# Patient Record
Sex: Male | Born: 1995 | Race: White | Marital: Single | Smoking: Never smoker
Health system: Southern US, Community
[De-identification: ages and names within clinical notes are randomized; demographics above are authoritative.]

## PROBLEM LIST (undated history)

## (undated) DIAGNOSIS — J45909 Unspecified asthma, uncomplicated: Secondary | ICD-10-CM

## (undated) HISTORY — DX: Unspecified asthma, uncomplicated: J45.909

---
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
## 2015-08-27 DIAGNOSIS — J029 Acute pharyngitis, unspecified: Secondary | ICD-10-CM | POA: Diagnosis not present

## 2016-01-01 ENCOUNTER — Inpatient Hospital Stay: Admit: 2016-01-01 | Payer: Self-pay

## 2016-01-01 ENCOUNTER — Other Ambulatory Visit: Payer: Self-pay | Admitting: Family Medicine

## 2016-01-01 ENCOUNTER — Ambulatory Visit
Admission: RE | Admit: 2016-01-01 | Discharge: 2016-01-01 | Disposition: A | Discharge status: Home / Self Care | Payer: Self-pay | Source: Ambulatory Visit | Attending: Family Medicine | Admitting: Family Medicine

## 2016-01-01 DIAGNOSIS — R52 Pain, unspecified: Secondary | ICD-10-CM

## 2016-01-01 DIAGNOSIS — M25521 Pain in right elbow: Secondary | ICD-10-CM | POA: Insufficient documentation

## 2016-01-07 DIAGNOSIS — M25521 Pain in right elbow: Secondary | ICD-10-CM | POA: Diagnosis not present

## 2016-01-11 ENCOUNTER — Other Ambulatory Visit: Payer: Self-pay | Admitting: Family Medicine

## 2016-01-11 ENCOUNTER — Ambulatory Visit
Admission: RE | Admit: 2016-01-11 | Discharge: 2016-01-11 | Disposition: A | Discharge status: Home / Self Care | Payer: BLUE CROSS/BLUE SHIELD | Source: Ambulatory Visit | Attending: Family Medicine | Admitting: Family Medicine

## 2016-01-11 DIAGNOSIS — M25621 Stiffness of right elbow, not elsewhere classified: Secondary | ICD-10-CM

## 2016-01-11 DIAGNOSIS — M25521 Pain in right elbow: Secondary | ICD-10-CM | POA: Diagnosis present

## 2016-01-11 DIAGNOSIS — T1490 Injury, unspecified: Secondary | ICD-10-CM | POA: Diagnosis present

## 2016-01-11 DIAGNOSIS — M25421 Effusion, right elbow: Secondary | ICD-10-CM | POA: Insufficient documentation

## 2016-01-11 DIAGNOSIS — Y9367 Activity, basketball: Secondary | ICD-10-CM | POA: Diagnosis not present

## 2017-03-16 ENCOUNTER — Encounter: Payer: Self-pay | Admitting: Family Medicine

## 2017-03-16 ENCOUNTER — Ambulatory Visit (INDEPENDENT_AMBULATORY_CARE_PROVIDER_SITE_OTHER): Payer: BLUE CROSS/BLUE SHIELD | Admitting: Family Medicine

## 2017-03-16 VITALS — BP 120/70 | HR 63 | Temp 97.4°F | Resp 16

## 2017-03-16 DIAGNOSIS — J3089 Other allergic rhinitis: Secondary | ICD-10-CM

## 2017-03-16 DIAGNOSIS — J45901 Unspecified asthma with (acute) exacerbation: Secondary | ICD-10-CM | POA: Diagnosis not present

## 2017-03-16 MED ORDER — ALBUTEROL SULFATE HFA 108 (90 BASE) MCG/ACT IN AERS: 2.0000 | INHALATION_SPRAY | Freq: Four times a day (QID) | RESPIRATORY_TRACT | 2 refills | Status: AC | PRN

## 2017-03-16 NOTE — Progress Notes (Signed)
Patient presents today with symptoms of seasonal allergies. Patient states that on Tuesday he believes he had an asthma attack. His symptoms resolved after taking albuterol inhaler that was in the training room. Patient states that he had a history of asthma as a child but was told that he had outgrown it. He denies feeling anxious such as a panic attack when he was having the symptoms. He states it was right before a baseball game. He has had some rhinorrhea and nasal congestion which she has been taking Zyrtec and Flonase for. He has had allergy testing in the past. He has not had any problems since that time. He requests to have a Albuterol inhaler in case he has symptoms again. He denies taking any other medications or supplements. He denies any chest pain or shortness of breath currently. He denies any productive cough or fever.  ROS: Negative except mentioned above. Vitals as per Epic.  GENERAL: NAD HEENT: mild pharyngeal erythema, no exudate, no erythema of TMs, no cervical LAD RESP: CTA B, no wheezing, no accessory muscle use  CARD: RRR NEURO: CN II-XII grossly intact   A/P: Seasonal Allergies, Asthma - continue oral antihistamine and nasal steroid, will prescribe albuterol inhaler to use. Discussed using before physical activity if outdoors during high allergy season. Will seek medical attention if symptoms persist or worsen as discussed. Encourage patient to keep inhaler with him at all times.

## 2017-09-01 ENCOUNTER — Ambulatory Visit (INDEPENDENT_AMBULATORY_CARE_PROVIDER_SITE_OTHER): Payer: BLUE CROSS/BLUE SHIELD | Admitting: Family Medicine

## 2017-09-01 ENCOUNTER — Encounter: Payer: Self-pay | Admitting: Family Medicine

## 2017-09-01 VITALS — Temp 97.3°F

## 2017-09-01 DIAGNOSIS — L239 Allergic contact dermatitis, unspecified cause: Secondary | ICD-10-CM

## 2017-09-01 MED ORDER — METHYLPREDNISOLONE 4 MG PO TBPK: ORAL_TABLET | ORAL | 0 refills | Status: AC

## 2017-09-01 NOTE — Progress Notes (Signed)
Patient presents today with symptoms of itchy rash on the left ankle lower leg area. Patient states that last week he scraped his lateral ankle with a bike pedal. He states that it was a very small abrasion which healed and scabbed over. A few days ago he was outdoors and the next day he noticed red slightly itchy rash around the lateral ankle/lower leg area. It is now has small blisters. He denies any pain along the area. He is sure that he had a tetanus booster the summer. He denies any fever or chills or any rash anywhere else.  ROS: Negative except mentioned above. Vitals as per Epic.  GENERAL: NAD SKIN: Erythematous vesicular rash noted in the lateral left ankle/lower leg area, there is no warmth of the area, there is no streaks, there is no tender wound where there was an abrasion from the bike pedal NEURO: CN II-XII grossly intact   A/P: Skin rash - appears to be contact dermatitis, will put patient on Medrol Dosepak for 6 days, will consider extending it if it improves but has not resolved and 6 days, oral antihistamine when necessary, keep area clean and dry, encourage patient to follow up with parent regarding tetanus status.

## 2017-10-02 ENCOUNTER — Ambulatory Visit
Admission: RE | Admit: 2017-10-02 | Discharge: 2017-10-02 | Disposition: A | Discharge status: Home / Self Care | Payer: BLUE CROSS/BLUE SHIELD | Source: Ambulatory Visit | Attending: Family Medicine | Admitting: Family Medicine

## 2017-10-02 ENCOUNTER — Other Ambulatory Visit: Payer: Self-pay | Admitting: Family Medicine

## 2017-10-02 DIAGNOSIS — M7989 Other specified soft tissue disorders: Secondary | ICD-10-CM | POA: Diagnosis not present

## 2017-10-02 DIAGNOSIS — M79672 Pain in left foot: Secondary | ICD-10-CM

## 2017-10-04 ENCOUNTER — Other Ambulatory Visit: Payer: Self-pay | Admitting: Family Medicine

## 2017-10-04 DIAGNOSIS — M79672 Pain in left foot: Principal | ICD-10-CM

## 2017-10-04 DIAGNOSIS — G8929 Other chronic pain: Secondary | ICD-10-CM

## 2017-10-06 ENCOUNTER — Ambulatory Visit
Admission: RE | Admit: 2017-10-06 | Discharge: 2017-10-06 | Disposition: A | Discharge status: Home / Self Care | Payer: BLUE CROSS/BLUE SHIELD | Source: Ambulatory Visit | Attending: Family Medicine | Admitting: Family Medicine

## 2017-10-06 DIAGNOSIS — G8929 Other chronic pain: Secondary | ICD-10-CM | POA: Diagnosis present

## 2017-10-06 DIAGNOSIS — R600 Localized edema: Secondary | ICD-10-CM | POA: Diagnosis not present

## 2017-10-06 DIAGNOSIS — M79672 Pain in left foot: Secondary | ICD-10-CM | POA: Diagnosis present

## 2018-07-31 ENCOUNTER — Ambulatory Visit (INDEPENDENT_AMBULATORY_CARE_PROVIDER_SITE_OTHER): Payer: BLUE CROSS/BLUE SHIELD | Admitting: Family Medicine

## 2018-07-31 ENCOUNTER — Ambulatory Visit
Admission: RE | Admit: 2018-07-31 | Discharge: 2018-07-31 | Disposition: A | Discharge status: Home / Self Care | Payer: BLUE CROSS/BLUE SHIELD | Source: Ambulatory Visit | Attending: Family Medicine | Admitting: Family Medicine

## 2018-07-31 ENCOUNTER — Encounter: Payer: Self-pay | Admitting: Family Medicine

## 2018-07-31 DIAGNOSIS — S99821A Other specified injuries of right foot, initial encounter: Secondary | ICD-10-CM | POA: Diagnosis not present

## 2018-07-31 DIAGNOSIS — S99921A Unspecified injury of right foot, initial encounter: Secondary | ICD-10-CM | POA: Diagnosis present

## 2018-07-31 DIAGNOSIS — X58XXXA Exposure to other specified factors, initial encounter: Secondary | ICD-10-CM | POA: Insufficient documentation

## 2018-07-31 MED ORDER — SULFAMETHOXAZOLE-TRIMETHOPRIM 800-160 MG PO TABS: 1.0000 | ORAL_TABLET | Freq: Two times a day (BID) | ORAL | 0 refills | Status: AC

## 2018-08-01 NOTE — Progress Notes (Signed)
Patient presents with symptoms of right big toe pain and discharge. A tipped baseball hit his right big toe a few days ago. Was treated like a nail injury (subungal hematoma) and contusion by the trainer initially but yesterday there seem to be pus draining from around one side of the nail. The area was cleaned with Hibeclens yesterday. The patient states it feels better today. Denies any fever or any other injury. Denies any significant problems walking.   ROS: Negative except mentioned above. Vitals as per Epic.  GENERAL: NAD MSK: minimal swelling of the distal aspect of the right great toe, FROM, nv intact  SKIN: minimal swelling and erythema around the cuticle of the right big toe, nail appears intact at the cuticle, one side of the distal aspect of the nail is not attached, some clear fluid expressed from that side, no streaks appreciated   NEURO: CN II-XII grossly intact    A/P: R Big Toe Injury/ Nail Injury - will start patient on Bactrim DS, will get Xray, Tylenol/Ibuprofen prn, keep area clean and dry, keep wrapped/covered if any further drainage, monitor closely for any worsening symptoms, less likely but still a possibility to lose the nail, keep nail trimmed, activity as tolerated if xray negative for fracture, seek medical attention if needed.

## 2018-08-13 ENCOUNTER — Encounter: Payer: Self-pay | Admitting: Family Medicine

## 2018-08-13 ENCOUNTER — Ambulatory Visit (INDEPENDENT_AMBULATORY_CARE_PROVIDER_SITE_OTHER): Payer: BLUE CROSS/BLUE SHIELD | Admitting: Family Medicine

## 2018-08-13 VITALS — Temp 98.6°F

## 2018-08-13 DIAGNOSIS — R2241 Localized swelling, mass and lump, right lower limb: Secondary | ICD-10-CM

## 2018-08-14 ENCOUNTER — Encounter (INDEPENDENT_AMBULATORY_CARE_PROVIDER_SITE_OTHER): Payer: Self-pay

## 2018-08-14 ENCOUNTER — Ambulatory Visit
Admission: RE | Admit: 2018-08-14 | Discharge: 2018-08-14 | Disposition: A | Discharge status: Home / Self Care | Payer: BLUE CROSS/BLUE SHIELD | Source: Ambulatory Visit | Attending: Family Medicine | Admitting: Family Medicine

## 2018-08-14 DIAGNOSIS — R2241 Localized swelling, mass and lump, right lower limb: Secondary | ICD-10-CM | POA: Diagnosis present

## 2018-08-15 ENCOUNTER — Other Ambulatory Visit: Payer: Self-pay | Admitting: Family Medicine

## 2018-08-15 DIAGNOSIS — R2241 Localized swelling, mass and lump, right lower limb: Secondary | ICD-10-CM

## 2018-08-16 ENCOUNTER — Ambulatory Visit: Admission: RE | Admit: 2018-08-16 | Payer: BLUE CROSS/BLUE SHIELD | Source: Ambulatory Visit

## 2018-08-21 ENCOUNTER — Ambulatory Visit
Admission: RE | Admit: 2018-08-21 | Discharge: 2018-08-21 | Disposition: A | Discharge status: Home / Self Care | Payer: BLUE CROSS/BLUE SHIELD | Source: Ambulatory Visit | Attending: Family Medicine | Admitting: Family Medicine

## 2018-08-21 DIAGNOSIS — R2241 Localized swelling, mass and lump, right lower limb: Secondary | ICD-10-CM | POA: Diagnosis present

## 2020-03-05 IMAGING — MR MR [PERSON_NAME] LOW W/O CM*R*
6 series · 40 of 40 positions shown · non-contrast
Comparison: None.

CLINICAL DATA: Palpable nontender soft tissue mass along the
anterior right leg more visible when standing.

EXAM:
MRI OF LOWER RIGHT EXTREMITY WITHOUT CONTRAST
TECHNIQUE: Multiplanar, multisequence MR imaging of the right leg was
performed. No intravenous contrast was administered.

[Series 5: composed cor t1_comp_filt · coronal · right · 5.0mm · 1.25mm/px · 3 of 35 slices shown]
[im 1/35]
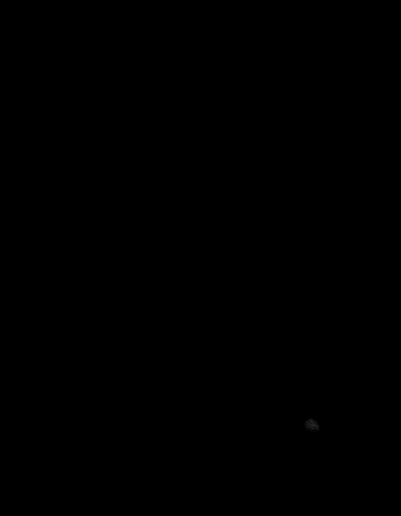
[im 18/35]
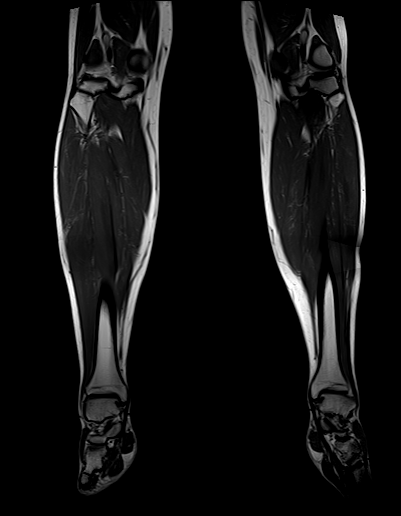
[im 35/35]
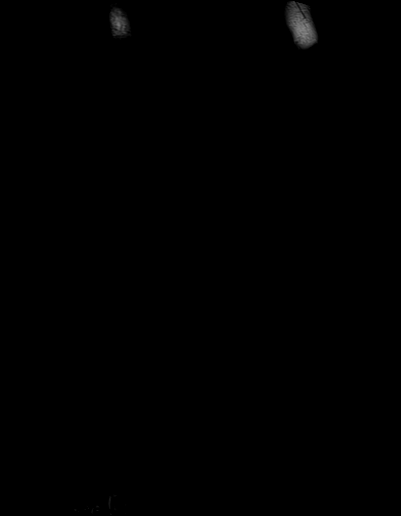

[Series 9: composed cor stir_comp_filt · coronal · right · 5.0mm · 1.25mm/px · 5 of 36 slices shown]
[im 1/36]
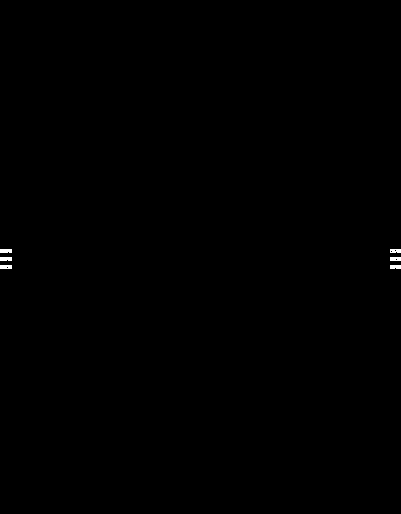
[im 9/36]
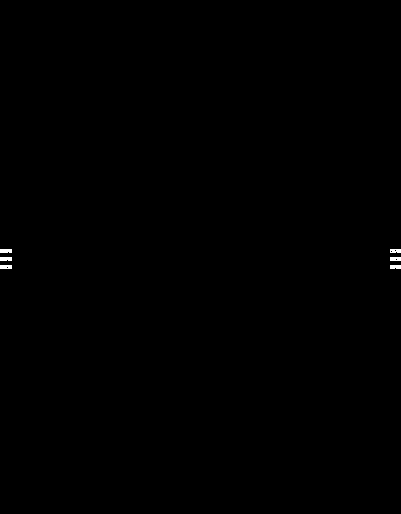
[im 18/36]
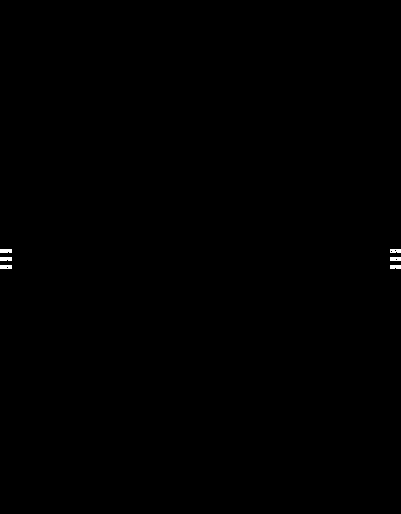
[im 27/36]
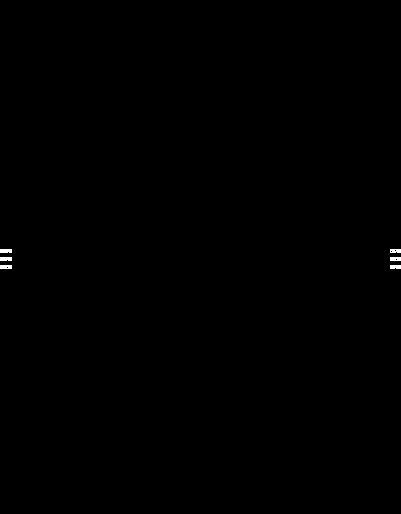
[im 36/36]
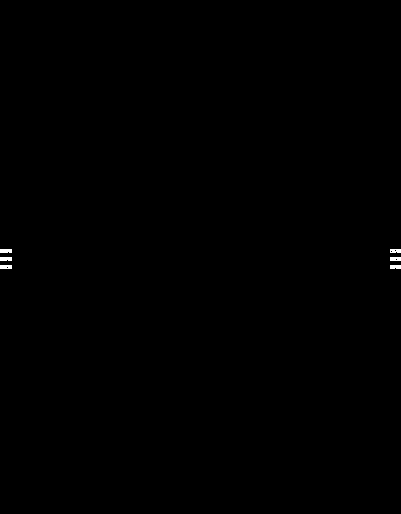

[Series 12: ax t1_comp · axial · right · 4.0mm · 0.70mm/px · z∈[-298,+217]mm · 13 of 104 slices shown]
[im 1/104]
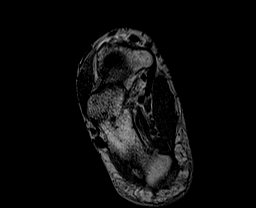
[im 9/104]
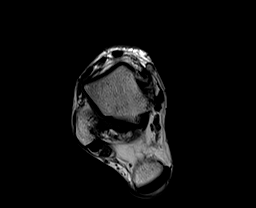
[im 18/104]
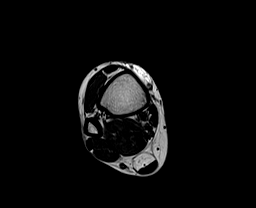
[im 26/104]
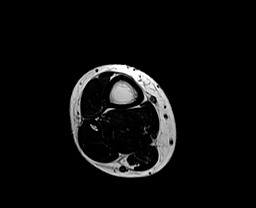
[im 35/104]
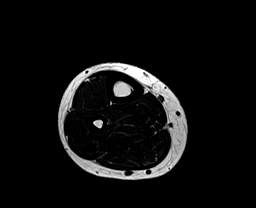
[im 43/104]
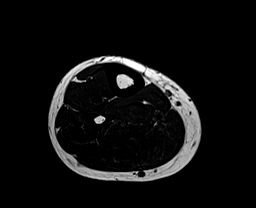
[im 52/104]
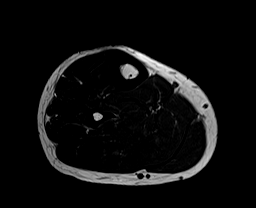
[im 61/104]
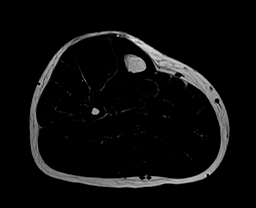
[im 69/104]
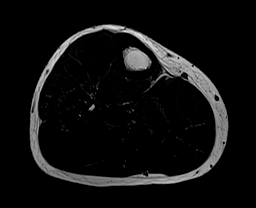
[im 78/104]
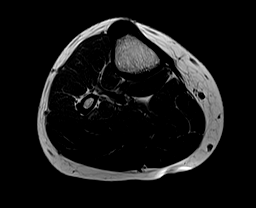
[im 86/104]
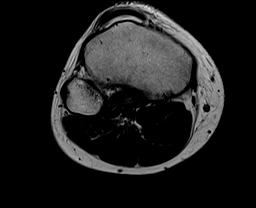
[im 95/104]
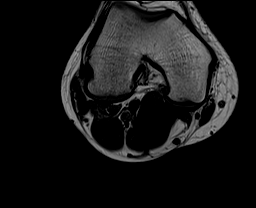
[im 104/104]
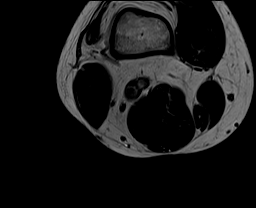

[Series 15: T2 · axial · right · 4.0mm · 0.70mm/px · z∈[-38,+217]mm · 7 of 52 slices shown (1 of 3)]
[im 1/52]
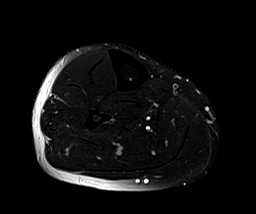
[im 9/52]
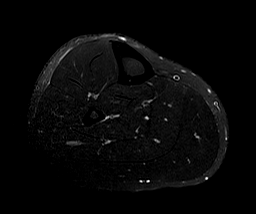
[im 18/52]
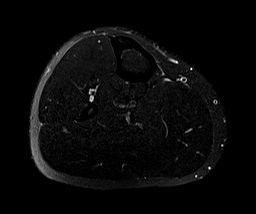
[im 26/52]
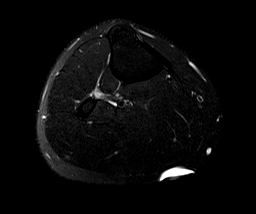
[im 35/52]
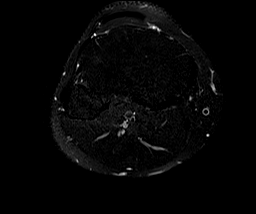
[im 43/52]
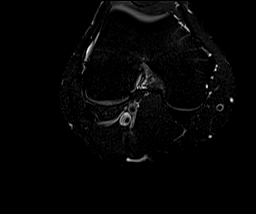
[im 52/52]
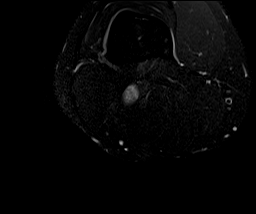

[Series 15: T2 · axial · right · 4.0mm · 0.70mm/px · z∈[-298,-43]mm · 7 of 52 slices shown (2 of 3)]
[im 1/52]
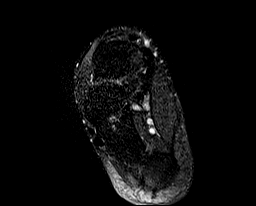
[im 9/52]
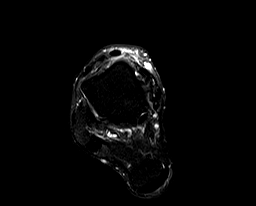
[im 18/52]
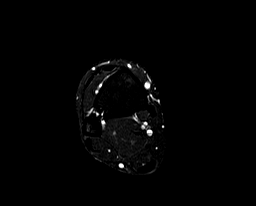
[im 26/52]
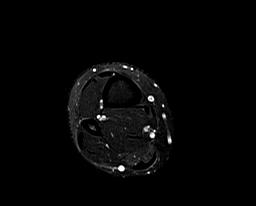
[im 35/52]
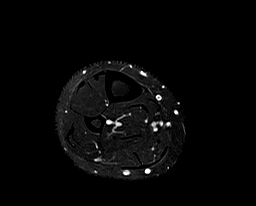
[im 43/52]
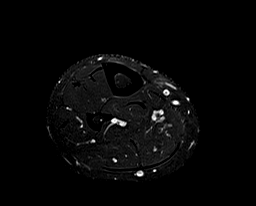
[im 52/52]
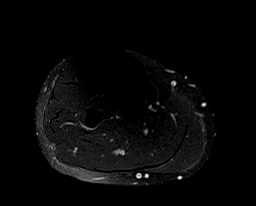

[Series 18: T2 · sagittal · right · 5.0mm · 0.86mm/px · 5 of 36 slices shown (3 of 3)]
[im 1/36]
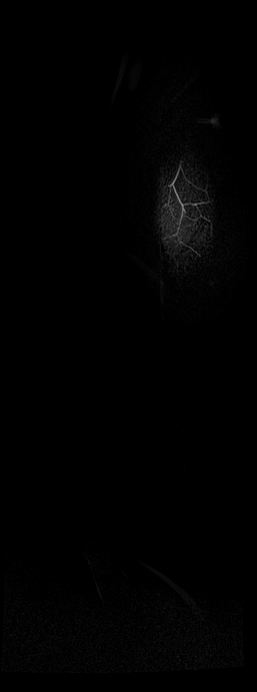
[im 9/36]
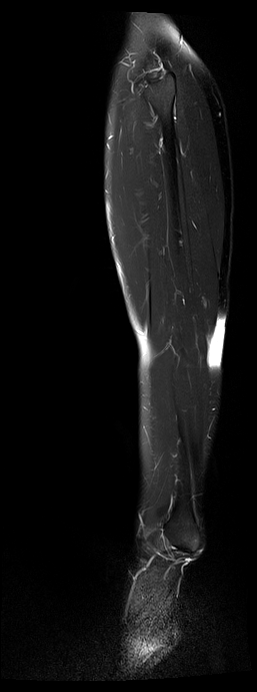
[im 18/36]
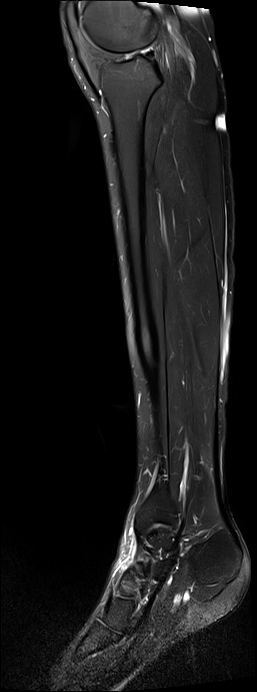
[im 27/36]
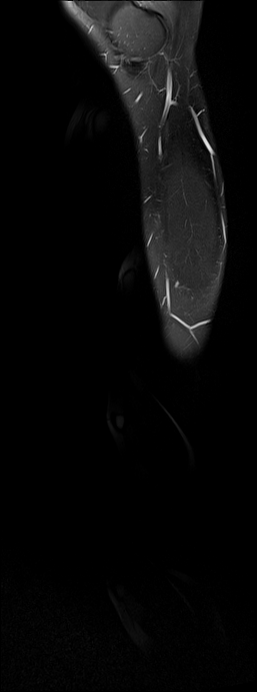
[im 36/36]
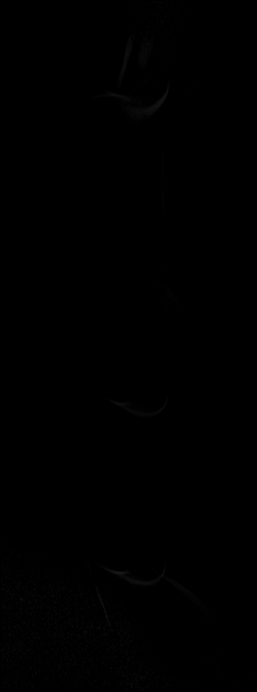

[40 of 40 positions shown; findings below may reference images not displayed]

FINDINGS: Bones/Joint/Cartilage

Negative for fracture, suspicious osseous lesions or bone
destruction. The included knee and ankle joints are maintained
without effusions.

Ligaments

Negative

Muscles and Tendons

No atrophy, intramuscular mass or hematoma. No tendinosis or tendon
tear of the included patellar tendon nor perineal, posteromedial nor
extensor tendons crossing the ankle joint. The Achilles tendon is
normal in morphology.

Soft tissues

No discrete soft tissue mass or fluid collection. No hematoma. No
vascular nor lymphatic abnormality identified to account for the
reported palpable soft tissue area. Conceivably a muscle herniation
might account for the symptoms given report of the abnormality
accentuating itself upon standing but this is not definitive with
the patient in supine position as imaged.
IMPRESSION: No discrete soft tissue mass or fluid collection. No acute osseous
abnormality. Consider ultrasound correlation in the presence of a
radiologist should signs and symptoms warrant.

## 2024-02-20 ENCOUNTER — Ambulatory Visit: Admit: 2024-02-20 | Discharge: 2024-02-20 | Payer: PRIVATE HEALTH INSURANCE | Attending: Adult Health

## 2024-02-20 ENCOUNTER — Ambulatory Visit: Admit: 2024-02-20 | Payer: PRIVATE HEALTH INSURANCE

## 2024-02-20 DIAGNOSIS — M545 Low back pain, unspecified: Secondary | ICD-10-CM

## 2024-02-20 MED ORDER — DICLOFENAC SODIUM 75 MG PO TBEC
75 | ORAL_TABLET | Freq: Two times a day (BID) | ORAL | 0 refills | Status: AC | PRN
Start: 2024-02-20 — End: ?

## 2024-02-20 NOTE — Progress Notes (Signed)
 Name: Brendan Foley  Date of Birth: March 25, 1996  Gender: male  MRN: [id number]    CC: Chronic right buttock pain, right leg pain     HPI: This is a 28 y.o. 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  He did have multiple injuries.  But since 2021 or earlier has had complaints of pain in the right buttock rating to the right groin and hip.  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  He did have bone scan in 2021 that revealed mild SI joint uptake.  MRI of the right hip was unremarkable.  He has done previous conservative treatment.  States that he is done injection in the past.  He is on meloxicam and does not find it beneficial.  He does chiropractic care usually can go 2 times a week for the last 3 to 4 months.  Pain is worse with prolonged sitting.  It is in the buttock down the lateral leg sometimes down the anterior leg into the lower shin.  He does have numbness in the outer lateral toes.      The patient denies any change in bowel or bladder function since the onset of the symptoms. he  has not had lumbar surgery in the past.      Thus far, the patient has tried NSAIDS and chiropractic care 3 months    Current pain level: 7  Activities limited by pain: Most activities           02/20/2024     9:49 AM   AMB PAIN ASSESSMENT   Location of Pain Back   Location Modifiers Right   Severity of Pain 7   Frequency of Pain Constant   Limiting Behavior Yes   Result of Injury No   Work-Related Injury No   Are there other pain locations you wish to document? No            ROS/Meds/PSH/PMH/FH/SH: I personally reviewed the patient's collected intake data.  Below are the pertinents:    Allergies   Allergen Reactions    Gramineae Pollens Other (See Comments)    Molds & Smuts Other (See Comments)    Seasonal Other (See Comments)    Peanut (Diagnostic) Other (See Comments) and Swelling     Other reaction(s): Unknown-Unspecified         Current Outpatient Medications:      amphetamine-dextroamphetamine (ADDERALL) 15 MG tablet, Take 1 tablet by mouth 2 times daily., Disp: , Rfl:     fluticasone (FLONASE) 50 MCG/ACT nasal spray, 2 sprays by Nasal route daily, Disp: , Rfl:     loratadine (CLARITIN REDITABS) 10 MG dissolvable tablet, Take 1 tablet by mouth, Disp: , Rfl:     valACYclovir (VALTREX) 1 g tablet, Take 2 tablets by mouth 2 times daily, Disp: , Rfl:     diclofenac (VOLTAREN) 75 MG EC tablet, Take 1 tablet by mouth 2 times daily as needed for Pain, Disp: 60 tablet, Rfl: 0    History reviewed. No pertinent surgical history.    There is no problem list on file for this patient.    Tobacco:  has no history on file for tobacco use.  Alcohol:   Social History     Substance and Sexual Activity   Alcohol Use None          Physical Exam:   BMI: Body mass index is 25.25 kg/m.    GENERAL:  Adult in no acute  distress, well developed, well nourished Patient is appropriately conversant  MSK:  Examination of the lumbar spine reveals spinal tenderness , paraspinal tenderness    There is mild tenderness to palpation along the spinous processes and paraspinal musculature.   The patient ambulates with a normal gait.    ROM of bilateral hip(s) reveals no irritability.   NEURO:  Cranial nerves grossly intact.  No motor deficits.    Straight leg testing is positive right  Sensory testing reveals intact sensation to light touch and in the distribution of the L3-S1 dermatomes bilaterally  Ankle jerk is negative for clonus    Reflexes   Right Left   Quadriceps (L4) 2 2   Achilles (S1) 2 2     Strength testing in the lower extremity reveals the following based on the 5 point grading scale:     HF (L2) H Ab (L5) KE (L3/4) ADF (L4) EHL (L5) A Ev (S1) APF (S1)   Right 5 5 5 5 5 5 5    Left 5 5 5 5 5 5 5      PSYCH:  Alert and oriented X 3.  Appropriate affect.  Intact judgment and insight.       Radiographic Studies:     AP, lateral and spot views of the lumbar spine:      There is loss of lumbar lordosis.   Mild to moderate disc degeneration at L4-5.  No fractures or lesions.    Interpretation: Degenerative disc disease at L4-5      MRI of Lumbar spine images independently reviewed:   From 2021   MR LUMBAR WITHOUT CONTRAST     HISTORY:  Low back pain radiculopathy     COMPARISON: 03/02/2020     TECHNIQUE:  Multiplanar, multisequence MR images of the lumbar spine were obtained without intravenous contrast.     FINDINGS:     Vertebral bodies are of normal height, alignment, and signal characteristics. There are 5 non-rib bearing and lumbar vertebral bodies.     Conus medullaris terminates at the T12 level. No abnormal cord signal.     Visualized paraspinal and retroperitoneal soft tissue structures are unremarkable.     Level specific findings:     T12-L1:No canal or neural foraminal narrowing.     L1-L2: No canal or neural foraminal narrowing.     L2-L3: No canal or neural foraminal narrowing.     L3-L4: No canal or neural foraminal narrowing.     L4-L5: There is a shallow focal central disc protrusion. There is a small posterior annular tear. There is mild central stenosis. No significant foraminal stenosis.Marland Kitchen     L5-S1: No canal or neural foraminal narrowing.         Assessment/Plan:       Diagnosis Orders   1. Low back pain, unspecified back pain laterality, unspecified chronicity, unspecified whether sciatica present  XR LUMBAR SPINE (2-3 VIEWS)      2. Lumbar radiculopathy        3. Degeneration of intervertebral disc of lumbar region with discogenic back pain and lower extremity pain            This patient's clinical history and physical exam is consistent with a right  L-4 and L-5 lumbar radiculopathy. We discussed the natural history of lumbar radiculopathy in that many of these patients have near complete resolution of their symptoms within eight to twelve weeks with conservative care. We discussed that conservative treatments typically start with activity modification, and  medication followed by physical therapy  as symptoms allow.  Oral and/or epidural steroids are other options. I also discussed potential surgical options if the symptoms fail to improve or there is a progressive neurologic deficit and conservative management has been exhausted.  We discussed that surgery is not typically a reliable treatment for isolated back pain, but is usually very reliable in relieving buttock and leg symptoms.        - A home exercise program was prescribed for stretching and strengthening. A list of exercises was provided.   - If the patient fails to respond to these efforts, I would recommend MRI of the lumbar spine for further evaluation.  - NSAID: The patient is agreeable to a trial of nonsteroidal anti-inflammatory drugs (NSAIDs). We discussed risks associated with their use, including GI tract or other bleeding, and also some cardiac risk. Instructions were given to discontinue the NSAID if there is any sign of GI bleed, chest pain, or shortness of breath.  Continued use of NSAIDS is recommended to be managed by PCP to monitor kidney and liver function.    4 This is a undiagnosed new problem with uncertain prognosis    Orders Placed This Encounter   Medications    diclofenac (VOLTAREN) 75 MG EC tablet     Sig: Take 1 tablet by mouth 2 times daily as needed for Pain     Dispense:  60 tablet     Refill:  0        Orders Placed This Encounter   Procedures    XR LUMBAR SPINE (2-3 VIEWS)            Return in about 6 weeks (around 04/02/2024).     Truman Hayward, APRN - CNP  02/20/24      Elements of this note were created using speech recognition software.  As such, errors of speech recognition may be present.

## 2024-05-21 NOTE — Telephone Encounter (Signed)
 Lvm for patient     He needs to schedule a follow up for documentation for insurance purposes

## 2024-05-21 NOTE — Telephone Encounter (Signed)
-----   Message from Arthur C sent at 05/21/2024  2:00 PM EDT -----  Regarding: Brendan Foley Specialty Message to Provider  Specialty Message to Provider    Relationship to Patient: Self     Patient Message: wants to get an MRI, said it was talked about but there is no referral for an MRI  --------------------------------------------------------------------------------------------------------------------------    Call Back Information: OK to leave message on voicemail  Preferred Call Back Number: Phone [phone]

## 2024-05-21 NOTE — Telephone Encounter (Signed)
 Patient needs a follow up with Jaime for updated documentation for insurance   LMVM

## 2024-05-29 ENCOUNTER — Ambulatory Visit: Admit: 2024-05-29 | Discharge: 2024-05-29 | Payer: PRIVATE HEALTH INSURANCE | Attending: Adult Health

## 2024-05-29 DIAGNOSIS — M51362 Other intervertebral disc degeneration, lumbar region with discogenic back pain and lower extremity pain: Principal | ICD-10-CM

## 2024-05-29 NOTE — Progress Notes (Signed)
 Name: Brendan Foley  Date of Birth: 1996-04-05  Gender: male  MRN: [id number]    CC: Follow-up right buttock, leg pain    HPI: This is a 28 y.o. 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  He did have multiple injuries.  But since 2021 or earlier has had complaints of pain in the right buttock rating to the right groin and hip.  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  He did have bone scan in 2021 that revealed mild SI joint uptake.  MRI of the right hip was unremarkable.  He has done previous conservative treatment.  States that he is done injection in the past.  He is on meloxicam and does not find it beneficial.  He does chiropractic care usually can go 2 times a week for the last 3 to 4 months.  Pain is worse with prolonged sitting.  It is in the buttock down the lateral leg sometimes down the anterior leg into the lower shin.  He does have numbness in the outer lateral toes.      Patient has completed a home exercise program under my care for 6 weeks, from 02/20/24 to 05/29/24  performing exercises 5-6 times a week.These exercises included: Pelvic tilt, cat and camel, single knee-to-chest, double knee-to-chest, lower trunk rotation, pelvic bridging, prone on elbows, prone on extended arms, standing back extensions, side bend, prone upper extremity exercise, prone lower extremity exercise.    Medications trialed: Diclofenac     Patient has not noticed much difference with the home exercise regimen.           02/20/2024     9:49 AM   AMB PAIN ASSESSMENT   Location of Pain Back   Location Modifiers Right   Severity of Pain 7   Frequency of Pain Constant   Limiting Behavior Yes   Result of Injury No   Work-Related Injury No   Are there other pain locations you wish to document? No            Allergies   Allergen Reactions    Environmental/Seasonal Other (See Comments)    Gramineae Pollens Other (See Comments)    Molds & Smuts Other (See Comments)    Peanut  (Diagnostic) Other (See Comments) and Swelling     Other reaction(s): Unknown-Unspecified       Current Outpatient Medications:     amphetamine-dextroamphetamine (ADDERALL) 15 MG tablet, Take 1 tablet by mouth 2 times daily., Disp: , Rfl:     fluticasone (FLONASE) 50 MCG/ACT nasal spray, 2 sprays by Nasal route daily, Disp: , Rfl:     loratadine (CLARITIN REDITABS) 10 MG dissolvable tablet, Take 1 tablet by mouth, Disp: , Rfl:     valACYclovir (VALTREX) 1 g tablet, Take 2 tablets by mouth 2 times daily, Disp: , Rfl:     diclofenac  (VOLTAREN ) 75 MG EC tablet, Take 1 tablet by mouth 2 times daily as needed for Pain, Disp: 60 tablet, Rfl: 0  No past medical history on file.  Tobacco:  has no history on file for tobacco use.  Alcohol:   Social History     Substance and Sexual Activity   Alcohol Use Not on file        Pertinent Physical Exam:           Radiographic Studies:       MRI Results (most recent):      Assessment/Plan:  ICD-10-CM    1. Degeneration of intervertebral disc of lumbar region with discogenic back pain and lower extremity pain  M51.362 MRI LUMBAR SPINE WO CONTRAST      2. Lumbar radiculopathy  M54.16 MRI LUMBAR SPINE WO CONTRAST      3. Low back pain, unspecified back pain laterality, unspecified chronicity, unspecified whether sciatica present  M54.50 MRI LUMBAR SPINE WO CONTRAST           Patient continues to have right L4 radiculopathy complaints.  Despite 6 weeks of conservative treatment.  Patient will be referred for an MRI of the lumbar spine.  A follow-up after    - A MRI was ordered to delineate anatomy, confirm the diagnosis and assess the severity. Further deliniation of anatomy will allow provider to recommend further appropriate treatment plan including lumbar steroid injections vs. surgery.       No orders of the defined types were placed in this encounter.       Orders Placed This Encounter   Procedures    MRI LUMBAR SPINE WO CONTRAST        3 This is stable chronic  illness/condition      Return for MRI results.     Ami Skipper, APRN - CNP  05/29/24      Elements of this note were created using speech recognition software.  As such, errors of speech recognition may be present.

## 2024-06-06 ENCOUNTER — Ambulatory Visit: Admit: 2024-06-06 | Discharge: 2024-06-06 | Payer: PRIVATE HEALTH INSURANCE

## 2024-06-06 DIAGNOSIS — M51362 Other intervertebral disc degeneration, lumbar region with discogenic back pain and lower extremity pain: Principal | ICD-10-CM

## 2024-06-11 ENCOUNTER — Ambulatory Visit: Admit: 2024-06-11 | Discharge: 2024-06-11 | Payer: PRIVATE HEALTH INSURANCE | Attending: Adult Health

## 2024-06-11 DIAGNOSIS — M25559 Pain in unspecified hip: Principal | ICD-10-CM

## 2024-06-11 NOTE — Addendum Note (Signed)
 Addended by: BLINDA ULANDA SAILOR on: 06/11/2024 06:13 PM     Modules accepted: Orders

## 2024-06-11 NOTE — Progress Notes (Signed)
 Name: Brendan Foley  Date of Birth: June 26, 1996  Gender: male  MRN: [id number]    CC: Follow-up right buttock and leg pain, back pain    HPI: This is a 28 y.o. year old male who who who played college baseball.  He did have multiple injuries.  But since 2021 or earlier has had complaints of pain in the right buttock rating to the right groin and hip.  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  He did have bone scan in 2021 that revealed mild SI joint uptake.  MRI of the right hip was unremarkable.  He has done previous conservative treatment.  States that he is done injection in the past.  He is on meloxicam and does not find it beneficial.  He does chiropractic care usually can go 2 times a week for the last 3 to 4 months.  Pain is worse with prolonged sitting.  It is in the buttock down the lateral leg sometimes down the anterior leg into the lower shin.  He does have numbness in the outer lateral toes.      Patient has completed a home exercise program under my care for 6 weeks, from 02/20/24 to 05/29/24  performing exercises 5-6 times a week.These exercises included: Pelvic tilt, cat and camel, single knee-to-chest, double knee-to-chest, lower trunk rotation, pelvic bridging, prone on elbows, prone on extended arms, standing back extensions, side bend, prone upper extremity exercise, prone lower extremity exercise.    Medications trialed: Diclofenac     Patient has not noticed much difference with the home exercise regimen.    Patient also has complaints of his hip continuously popping.    Pain is a 7 out of 10.  At last visit patient was referred for an MRI of the lumbar spine           02/20/2024     9:49 AM   AMB PAIN ASSESSMENT   Location of Pain Back   Location Modifiers Right   Severity of Pain 7   Frequency of Pain Constant   Limiting Behavior Yes   Result of Injury No   Work-Related Injury No   Are there other pain locations you wish to document? No             Allergies   Allergen Reactions    Environmental/Seasonal Other (See Comments)    Gramineae Pollens Other (See Comments)    Molds & Smuts Other (See Comments)    Peanut (Diagnostic) Other (See Comments) and Swelling     Other reaction(s): Unknown-Unspecified       Current Outpatient Medications:     amphetamine-dextroamphetamine (ADDERALL) 15 MG tablet, Take 1 tablet by mouth 2 times daily., Disp: , Rfl:     fluticasone (FLONASE) 50 MCG/ACT nasal spray, 2 sprays by Nasal route daily, Disp: , Rfl:     loratadine (CLARITIN REDITABS) 10 MG dissolvable tablet, Take 1 tablet by mouth, Disp: , Rfl:     valACYclovir (VALTREX) 1 g tablet, Take 2 tablets by mouth 2 times daily, Disp: , Rfl:     diclofenac  (VOLTAREN ) 75 MG EC tablet, Take 1 tablet by mouth 2 times daily as needed for Pain, Disp: 60 tablet, Rfl: 0  No past medical history on file.  Tobacco:  has no history on file for tobacco use.  Alcohol:   Social History     Substance and Sexual Activity   Alcohol Use Not on file  Pertinent Physical Exam:           Radiographic Studies:       MRI Results (most recent):  MRI Result (most recent):  MRI LUMBAR SPINE WO CONTRAST 06/06/2024    Narrative  EXAMINATION: MRI LUMBAR SPINE 06/06/2024 1:14 PM    ACCESSION NUMBER: [id number]    INDICATION: Other intervertebral disc degeneration, lumbar region with  discogenic back pain and lower extremity pain; Radiculopathy, lumbar region; Low  back pain, unspecified low back pain radiating into right hip and right leg    COMPARISON: Lumbar spine x-rays 02/20/2024    TECHNIQUE: Multisequence, multiplanar MR images were obtained of the lumbar  spine without the administration of intravenous contrast.    FINDINGS: For the purposes of this examination, there will be 5 lumbar-type  vertebral bodies with vertebral body numbering performed with the designation of  the last well formed intervertebral disc as L5-S1.    There is straightening of the normal lumbar lordosis. Preserved  vertebral body  heights, without acute compression fracture or suspicious osseous lesion. S2  hemangioma.    The visualized portions of the distal spinal cord are of normal caliber and  signal characteristics.. The conus medullaris terminates at the L1 level. The  cauda equina is unremarkable.    The visualized abdominal soft tissues are unremarkable.      Level specific findings:    T12-L1: Unremarkable.    L1-L2: Unremarkable.    L2-L3: Unremarkable.    L3-L4: Unremarkable.    L4-L5: Small central protrusion mildly impressing upon the ventral thecal sac  and contributing to minimal spinal canal stenosis. There is minimal left  neuroforaminal narrowing. The right neural foramen is patent.    L5-S1: Unremarkable    Impression  1. Lumbar spine degenerative changes are mild, worst at L4-L5 where a small  central protrusion minimally narrows the spinal canal. There is also minimal  left neuroforaminal narrowing at L4-L5.    2. Lumbar spine degenerative changes are otherwise as detailed above, including  no high-grade spinal canal stenosis or high-grade neuroforaminal narrowing at  any level.    3. Straightening of the normal lumbar lordosis, which may be consequent to  patient positioning or muscle spasm.      Electronically signed by PURNELL DRESS    .  MRI images independently reviewed and reveal a central disc protrusion at L4-5 causing some degree of central stenosis.  There is degenerative disc signal as well.  Loss of lumbar lordosis    Assessment/Plan:        ICD-10-CM    1. Hip pain, unspecified laterality  M25.559 [facility]     Injection Authorization - Spine      2. Degeneration of intervertebral disc of lumbar region with discogenic back pain  M51.360            Low back pain and right buttock leg pain associated with degenerative disc disease at L4-5.  Obviously he is also had some history of some mild SI joint complaints.  MRI of the hip was unremarkable however he  complains of his hip continuously popping.  Given his strong athletic history I will refer him to [facility] for further evaluation of his hip complaints.  In regards to the low back and buttock pain we will look at pursuing an L4-5 ESI.  Patient will follow-up after his injection    - Injection: The patient will be referred for a lumbar steroid injection to help  reduce the symptoms. The patient understands the risks including hyperglycemia, immunosuppression, meningitis, cerebrospinal fluid leak, epidural hematoma, and reaction to medication. The patient may benefit from additional injections depending on the response. The injection should be a L4-5 ESI  - Referral to a hip/knee specialist to evaluate the possibility of an extraspinal source for the patients pain.      No orders of the defined types were placed in this encounter.       Orders Placed This Encounter   Procedures    [facility]    Injection Authorization - Spine        4 This is a chronic illness/condition with exacerbation and progression      Return for fu after injection/eval with [facility] for hip.     Ami Skipper, APRN - CNP  06/11/24      Elements of this note were created using speech recognition software.  As such, errors of speech recognition may be present.

## 2024-06-24 ENCOUNTER — Encounter: Payer: PRIVATE HEALTH INSURANCE | Attending: Physical Medicine & Rehabilitation

## 2024-07-01 ENCOUNTER — Encounter: Payer: PRIVATE HEALTH INSURANCE | Attending: Orthopaedic Surgery

## 2024-07-01 DIAGNOSIS — M25551 Pain in right hip: Principal | ICD-10-CM

## 2024-07-11 ENCOUNTER — Encounter: Payer: PRIVATE HEALTH INSURANCE | Attending: Physical Medicine & Rehabilitation

## 2024-07-16 ENCOUNTER — Encounter: Payer: PRIVATE HEALTH INSURANCE | Attending: Adult Health

## 2024-07-25 ENCOUNTER — Encounter: Payer: PRIVATE HEALTH INSURANCE | Attending: Physical Medicine & Rehabilitation
